# Patient Record
Sex: Male | Born: 1970 | Race: Black or African American | Hispanic: No | Marital: Married | Smoking: Never smoker
Health system: Southern US, Community
[De-identification: ages and names within clinical notes are randomized; demographics above are authoritative.]

---

## 2007-08-11 ENCOUNTER — Encounter: Admission: RE | Admit: 2007-08-11 | Discharge: 2007-08-11 | Payer: Self-pay | Admitting: General Practice

## 2009-05-12 IMAGING — CR DG CERVICAL SPINE COMPLETE 4+V
6 series · 6 of 6 positions shown · non-contrast
Comparison: None.

CLINICAL DATA: Neck pain radiating into shoulders and down both
arms

CERVICAL SPINE - COMPLETE 4+ VIEW

[view not recorded (1 of 6)]
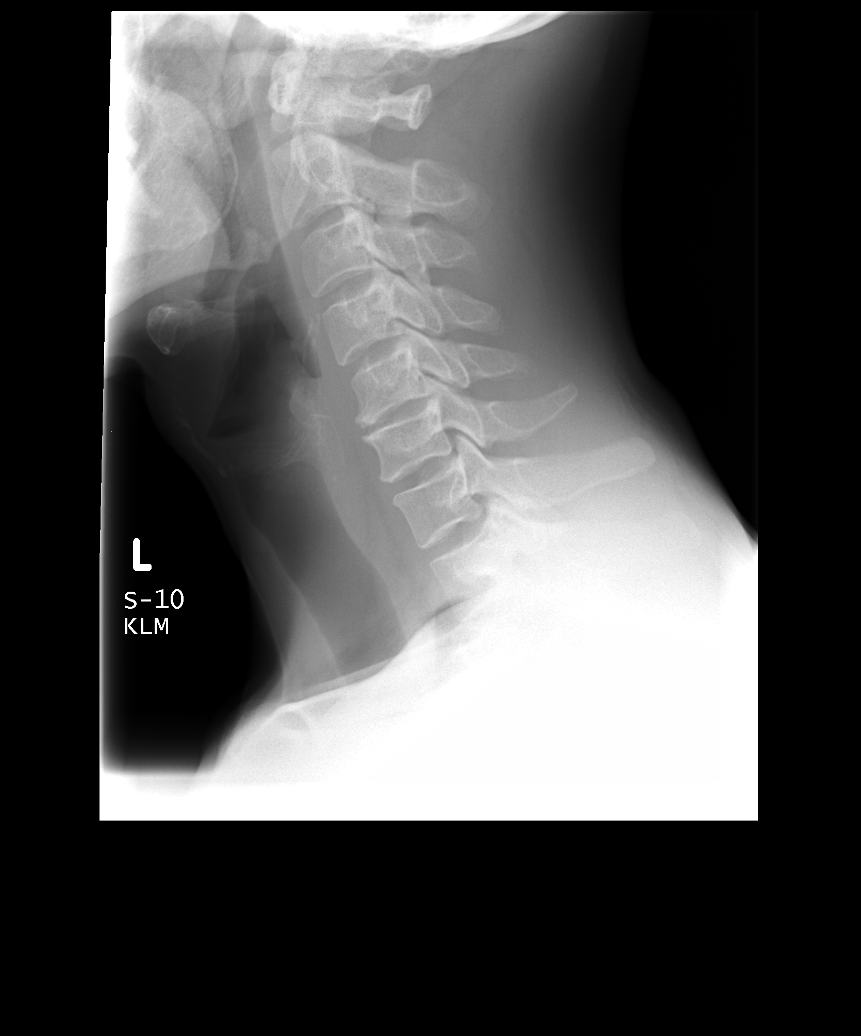

[view not recorded (2 of 6)]
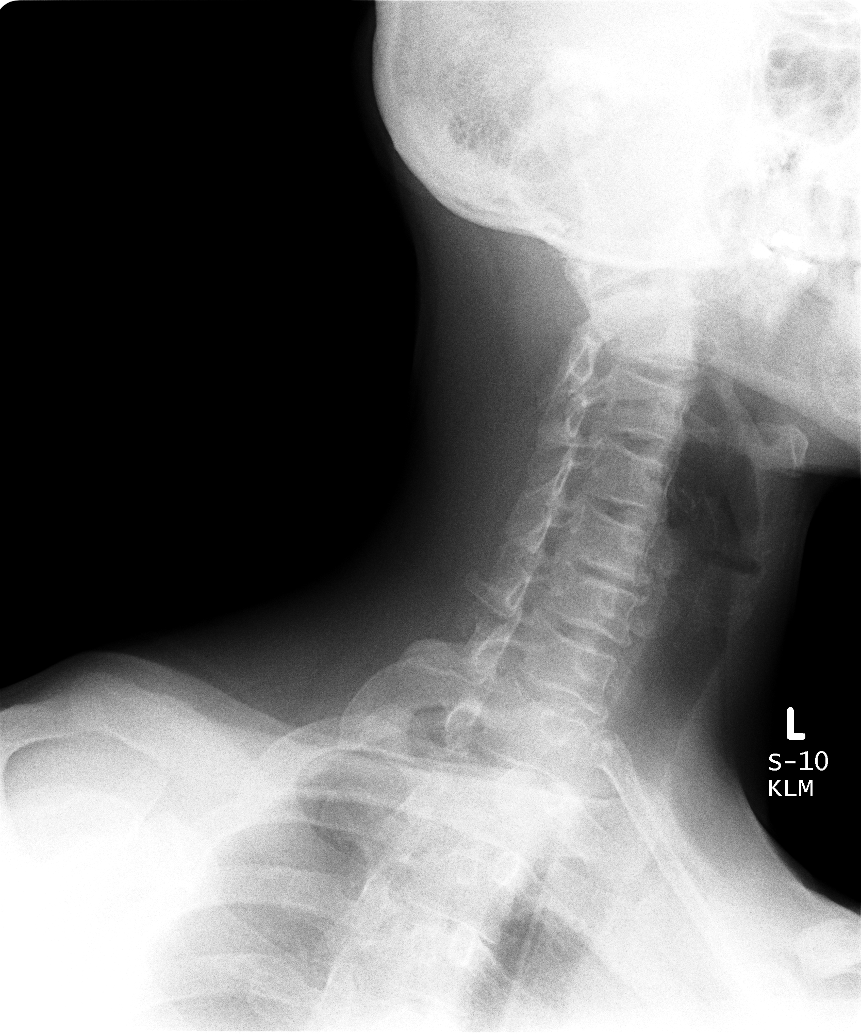

[view not recorded (3 of 6)]
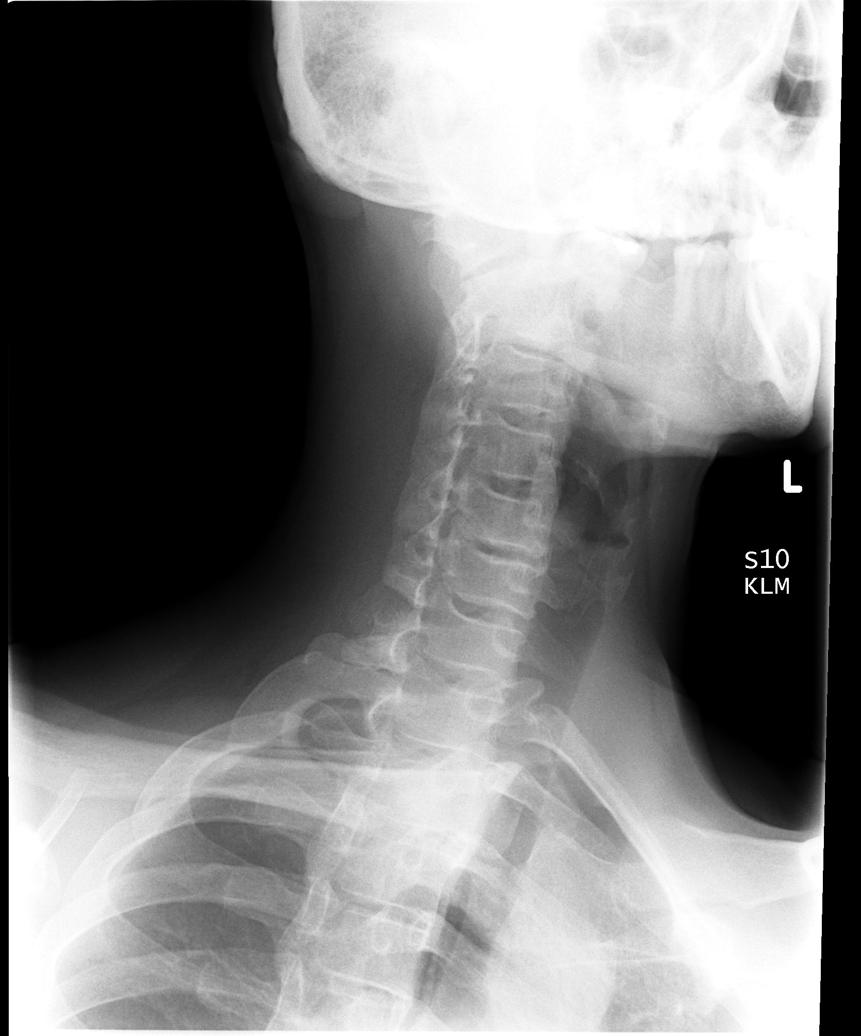

[view not recorded (4 of 6)]
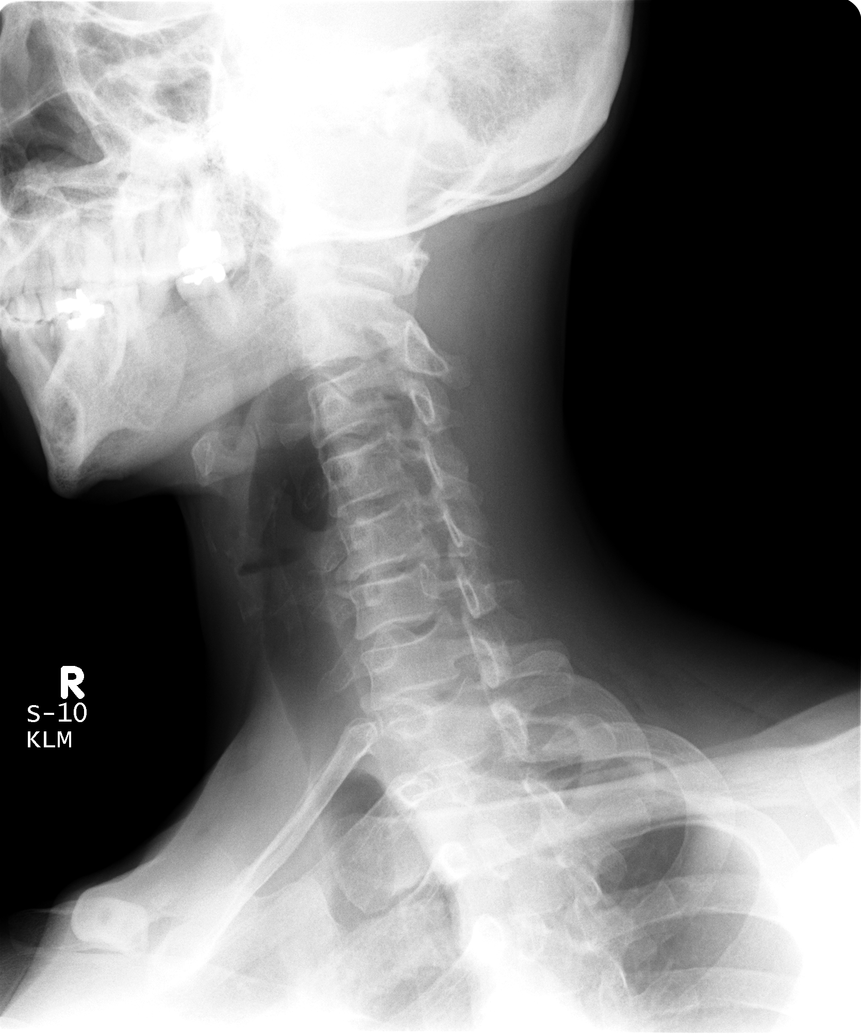

[view not recorded (5 of 6)]
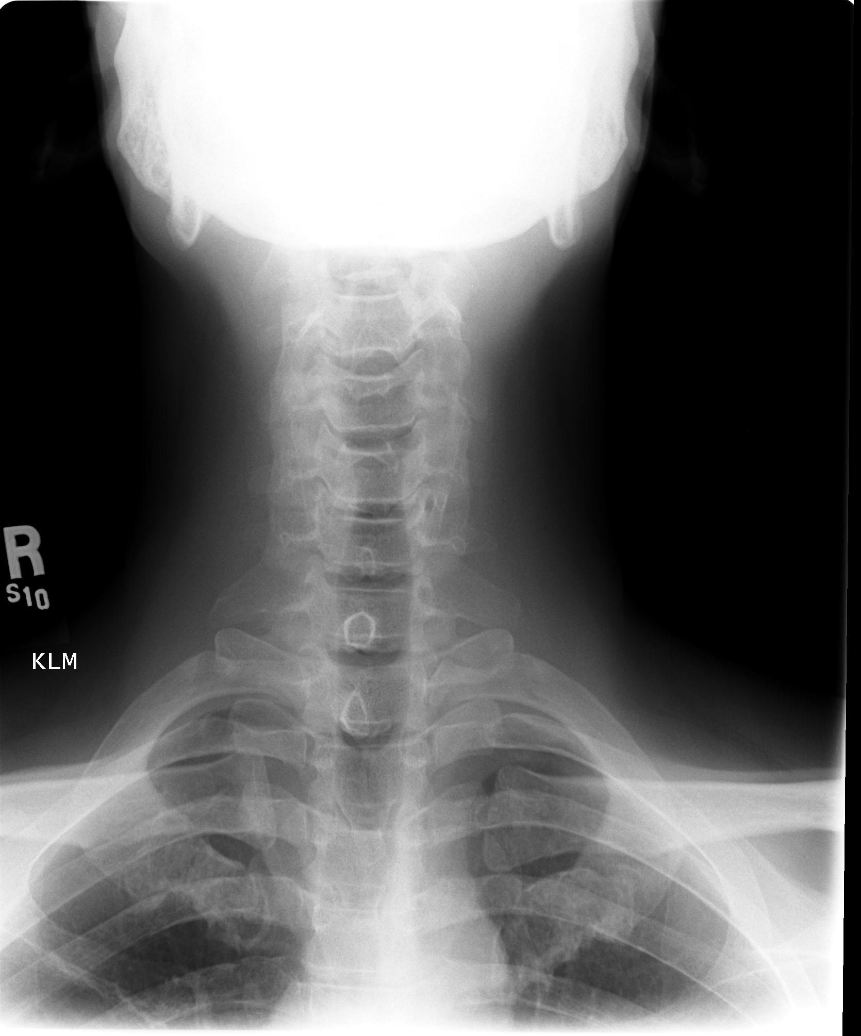

[view not recorded (6 of 6)]
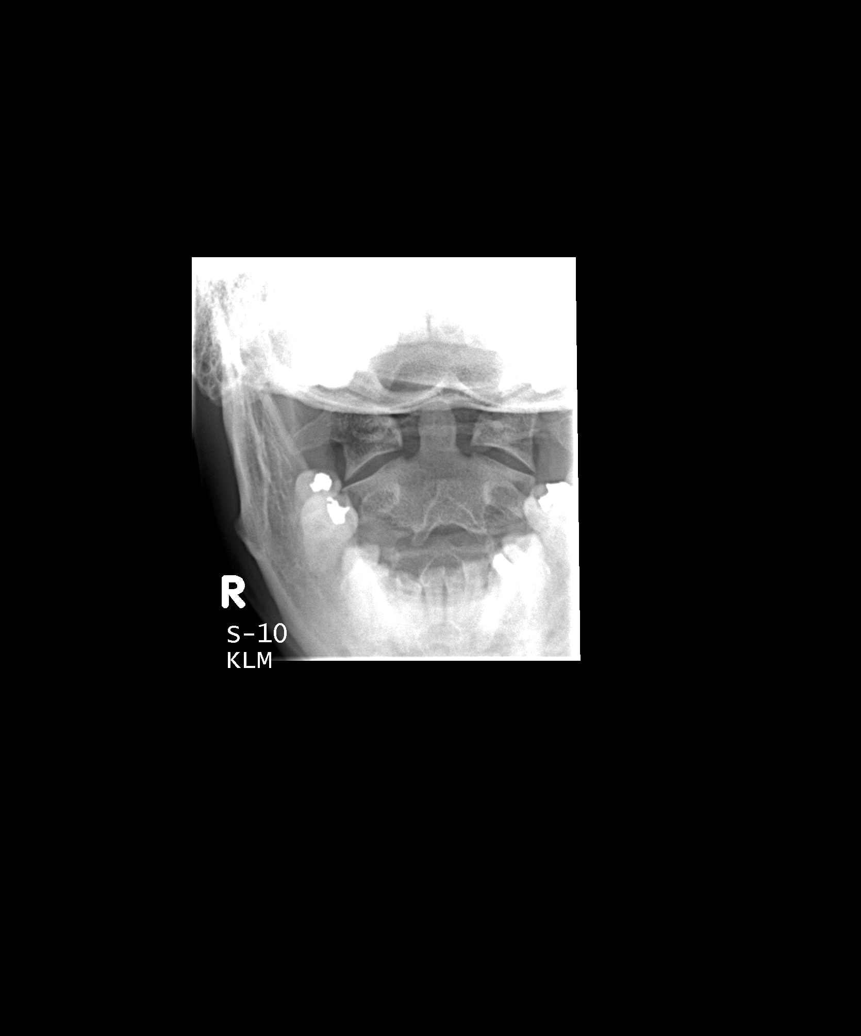

[6 of 6 positions shown; findings below may reference images not displayed]

FINDINGS: Alignment is anatomic.  There is disc space narrowing C5-
6 with osteophyte formation.  Bilateral neural foraminal narrowing
is present at this level.  No fracture or subluxation can be seen,
and the AP/odontoid views are unremarkable.  Lung apices are clear.
No abnormal calcifications are present.
IMPRESSION: Cervical spondylosis C5-6

## 2015-12-28 ENCOUNTER — Encounter (HOSPITAL_COMMUNITY): Payer: Self-pay | Admitting: Emergency Medicine

## 2015-12-28 ENCOUNTER — Ambulatory Visit (HOSPITAL_COMMUNITY)
Admission: EM | Admit: 2015-12-28 | Discharge: 2015-12-28 | Disposition: A | Discharge status: Home / Self Care | Payer: Managed Care, Other (non HMO) | Attending: Physician Assistant | Admitting: Physician Assistant

## 2015-12-28 DIAGNOSIS — F419 Anxiety disorder, unspecified: Secondary | ICD-10-CM | POA: Diagnosis not present

## 2015-12-28 DIAGNOSIS — R0789 Other chest pain: Secondary | ICD-10-CM

## 2015-12-28 MED ORDER — NAPROXEN 500 MG PO TABS: ORAL_TABLET | ORAL | 0 refills | Status: DC

## 2015-12-28 MED ORDER — CLONAZEPAM 0.5 MG PO TABS: ORAL_TABLET | ORAL | 0 refills | Status: DC

## 2015-12-28 NOTE — ED Triage Notes (Signed)
Here for intermittent right sided CP onset 2 months that has been getting worse  Reports pain increases w/anxiety or when he gets exited  Denies HA, SOB, diaphoresis, past med hx  A&O x4... NAD

## 2015-12-28 NOTE — Discharge Instructions (Signed)
Could be related to anxiety. But also Muscular skeletal pain. Use the Klonopin as needed for anxiety to see if this helps or the more muscular pain can try Naprosyn. At any time this becomes urgent then go the ER. Otherwise establish with PCP

## 2015-12-28 NOTE — ED Provider Notes (Signed)
CSN: [id number]     Arrival date & time 12/28/15  1933 History   None    Chief Complaint  Patient presents with  . Chest Pain   (Consider location/radiation/quality/duration/timing/severity/associated sxs/prior Treatment) 45 yo black male presents with intermittent right sided chest pain x 2 months. He carries no significant past medical history.  He reports that it occurs mainly with stress or worry and resolves when he relaxes. He reports that exercise does not affect his pain and no reflux symptoms. No dyspnea or diaphoresis with this occurrence. No pain with touch or ROM.       History reviewed. No pertinent past medical history. History reviewed. No pertinent surgical history. History reviewed. No pertinent family history. Social History  Substance Use Topics  . Smoking status: Never Smoker  . Smokeless tobacco: Never Used  . Alcohol use No    Review of Systems  Constitutional: Negative for diaphoresis and fatigue.  Respiratory: Negative for choking, chest tightness, shortness of breath and wheezing.   Cardiovascular: Positive for chest pain. Negative for palpitations and leg swelling.  Gastrointestinal: Negative for abdominal pain, nausea and vomiting.  Musculoskeletal: Negative for back pain.  Neurological: Negative for dizziness, weakness, light-headedness and numbness.  Psychiatric/Behavioral: Positive for behavioral problems.       Noted reports of anxiety    Allergies  Review of patient's allergies indicates no known allergies.  Home Medications   Prior to Admission medications   Medication Sig Start Date End Date Taking? Authorizing Provider  clonazePAM (KLONOPIN) 0.5 MG tablet 1 tablet every 12 hours as needed for anxiety 12/28/15   Riki SheerMichelle G Saim Almanza, PA-C  naproxen (NAPROSYN) 500 MG tablet 1 tablet every 12 hours as needed for chest wall pain 12/28/15   Riki SheerMichelle G Ashlynne Shetterly, PA-C   Meds Ordered and Administered this Visit  Medications - No data to display  BP  112/83 (BP Location: Left Arm)   Pulse 60   Temp 97.7 F (36.5 C) (Oral)   Resp 12   SpO2 100%  No data found.   Physical Exam  Constitutional: He is oriented to person, place, and time. He appears well-developed and well-nourished. No distress.  Sitting comfortable without any distress. Laughing and talking  Cardiovascular: Normal rate, regular rhythm and normal heart sounds.   Pulmonary/Chest: Effort normal and breath sounds normal. No respiratory distress. He has no wheezes. He exhibits no tenderness.  No reproducible pain with palpation or ROM  Musculoskeletal: Normal range of motion.  Neurological: He is alert and oriented to person, place, and time.  Skin: Skin is warm and dry. He is not diaphoretic.  Nursing note and vitals reviewed.   Urgent Care Course   Clinical Course    .EKG Date/Time: 12/28/2015 8:48 PM Performed by: Riki SheerYOUNG, Berkleigh Beckles G Authorized by: Riki SheerYOUNG, Breahna Boylen G   ECG reviewed by ED Physician in the absence of a cardiologist: yes   Previous ECG:    Previous ECG:  Unavailable Interpretation:    Interpretation: non-specific   Rhythm:    Rhythm: sinus bradycardia   ST segments:    ST segments:  Normal T waves:    T waves: normal     (including critical care time)  Labs Review Labs Reviewed - No data to display  Imaging Review No results found.   Visual Acuity Review  Right Eye Distance:   Left Eye Distance:   Bilateral Distance:    Right Eye Near:   Left Eye Near:    Bilateral Near:  MDM   1. Chest wall pain   2. Anxiety    1. Mild langauge barrier but patient reports right sided chest pain with anxiety and "worry", but at times with lifting weights. At any rate no pain with exertion or other cardiac associated symptoms are evident. EKG with mild axis deviation and 57 HR. But non-specific in this setting. We are going to treat with NSAID's for chest wall pain. He is educated that if pain worsens and becomes emergent then  proceed to ED. He expresses understanding.  2. Going to try Klonopin short term for this anxiety and chest pain but this RX is limited and would need /fu with PCP to prescribe this. He expresses understanding. No emergent need tonight and stable for D/C.     Riki Sheer, PA-C 12/28/15 2104

## 2017-01-14 ENCOUNTER — Ambulatory Visit (HOSPITAL_COMMUNITY)
Admission: EM | Admit: 2017-01-14 | Discharge: 2017-01-14 | Disposition: A | Discharge status: Home / Self Care | Payer: Self-pay | Attending: Family Medicine | Admitting: Family Medicine

## 2017-01-14 ENCOUNTER — Encounter (HOSPITAL_COMMUNITY): Payer: Self-pay | Admitting: Emergency Medicine

## 2017-01-14 DIAGNOSIS — B9789 Other viral agents as the cause of diseases classified elsewhere: Secondary | ICD-10-CM

## 2017-01-14 DIAGNOSIS — J069 Acute upper respiratory infection, unspecified: Secondary | ICD-10-CM

## 2017-01-14 MED ORDER — BENZONATATE 100 MG PO CAPS: 100.0000 mg | ORAL_CAPSULE | Freq: Three times a day (TID) | ORAL | 0 refills | Status: DC

## 2017-01-14 NOTE — ED Triage Notes (Signed)
Onset of symptoms 3 days ago.  Complains of headache, cough, fever.  Denies runny nose or a stuffy nose

## 2017-01-14 NOTE — ED Provider Notes (Signed)
  [facility]   [id number] 01/14/17 Arrival Time: 1135   SUBJECTIVE:  Clifford Chapman is a 46 y.o. male who presents to the urgent care with complaint of cough, fatigue, and sore throat. Denies congestion or runny nose, loss of appetite, nausea, vomiting, diarrhea, chest pain or palpitations, or other symptoms. Does not smoke, no history of lung disease such as asthma or COPD. 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 No pertinent past medical history. No family history on file. Social History   Social History  . Marital status: Married    Spouse name: N/A  . Number of children: N/A  . Years of education: N/A   Occupational History  . Not on file.   Social History Main Topics  . Smoking status: Never Smoker  . Smokeless tobacco: Never Used  . Alcohol use No  . Drug use: Unknown  . Sexual activity: Not on file   Other Topics Concern  . Not on file   Social History Narrative  . No narrative on file   Current Meds  Medication Sig  . acetaminophen (TYLENOL) 325 MG tablet Take 650 mg by mouth every 6 (six) hours as needed.   No Known Allergies    ROS: As per HPI, remainder of ROS negative.   OBJECTIVE:   Vitals:   01/14/17 1259  BP: 105/69  Pulse: 87  Resp: 20  Temp: 99.5 F (37.5 C)  TempSrc: Oral  SpO2: 97%     General appearance: alert; no distress Eyes:  conjunctiva normal HENT: normocephalic; atraumatic; TMs normal, canal normal, external ears normal without trauma; nasal mucosa normal; oral mucosa normal Neck: supple, No cervical lymphadenopathy Lungs: clear to auscultation bilaterally Heart: regular rate and rhythm Abdomen: soft, non-tender; bowel sounds normal; no masses or organomegaly; no guarding or rebound tenderness Extremities: no cyanosis or edema; symmetrical with no gross deformities Skin: warm and dry Neurologic: normal gait; grossly normal Psychological: alert and  cooperative; normal mood and affect      Labs:  No results found for this or any previous visit.  Labs Reviewed - No data to display  No results found.     ASSESSMENT & PLAN:  1. Viral URI with cough     Meds ordered this encounter  Medications  . acetaminophen (TYLENOL) 325 MG tablet    Sig: Take 650 mg by mouth every 6 (six) hours as needed.  . benzonatate (TESSALON) 100 MG capsule    Sig: Take 1 capsule (100 mg total) by mouth every 8 (eight) hours.    Dispense:  21 capsule    Refill:  0    Reviewed expectations re: course of current medical issues. Questions answered. Outlined signs and symptoms indicating need for more acute intervention. Patient verbalized understanding. After Visit Summary given.    Procedures:        Dorena BodoKennard, Jullisa Grigoryan, NP 01/14/17 1344

## 2017-01-14 NOTE — Discharge Instructions (Signed)
You most likely have a viral uri, this type of infection will not be helped by antibiotics.  For your symptoms I have prescribed Tessalon, take one tablet every 8 hours as needed for cough.   In addition to these therapies, I advise rest, plenty of fluids and management of symptoms with over the counter medicines. Over the counter therapies for your symptoms include:Tylenol as needed every 4-6 hours for body aches or fever, not to exceed 4,000 mg a day, Take mucinex or mucinex DM ever 12 hours with a full glass of water, you may use an inhaled steroid such as Flonase, 2 sprays each nostril once a day for congestion, or an antihistamine such as Claritin or Zyrtec once a day. Another alternative for congestion, is a pseudoephedrine containing product available from the pharmacist. Should your symptoms worsen or fail to resolve, follow up with your primary care provider or return to clinic.

## 2017-09-08 ENCOUNTER — Ambulatory Visit (HOSPITAL_COMMUNITY)
Admission: EM | Admit: 2017-09-08 | Discharge: 2017-09-08 | Disposition: A | Discharge status: Home / Self Care | Payer: BLUE CROSS/BLUE SHIELD | Attending: Family Medicine | Admitting: Family Medicine

## 2017-09-08 ENCOUNTER — Encounter (HOSPITAL_COMMUNITY): Payer: Self-pay | Admitting: Emergency Medicine

## 2017-09-08 DIAGNOSIS — M62838 Other muscle spasm: Secondary | ICD-10-CM | POA: Diagnosis not present

## 2017-09-08 MED ORDER — DICLOFENAC SODIUM 75 MG PO TBEC: 75.0000 mg | DELAYED_RELEASE_TABLET | Freq: Two times a day (BID) | ORAL | 0 refills | Status: DC

## 2017-09-08 NOTE — ED Triage Notes (Signed)
Pt sts right sided neck pain x 10 days

## 2017-09-08 NOTE — ED Provider Notes (Signed)
[facility]   [id number] 09/08/17 Arrival Time: 1233  ASSESSMENT & PLAN:  1. Muscle spasms of neck    Meds ordered this encounter  Medications  . diclofenac (VOLTAREN) 75 MG EC tablet    Sig: Take 1 tablet (75 mg total) by mouth 2 (two) times daily.    Dispense:  14 tablet    Refill:  0   Encouraged ROM. Heat. Will f/u if not improving over the next week.  Reviewed expectations re: course of current medical issues. Questions answered. Outlined signs and symptoms indicating need for more acute intervention. Patient verbalized understanding. After Visit Summary given.  SUBJECTIVE: History from: patient. Clifford Chapman is a 47 y.o. male who reports intermittent mild pain of his bilateral neck (R>L) that is on and off; stable; described as stiff and aching without radiation. Onset: gradual, over the past 10 days or so. Injury/trama: no. Relieved by: nothing in particular. Worsened by: certain movements when present. Associated symptoms: none reported. Extremity sensation changes or weakness: none. Self treatment: has not tried OTCs for relief of pain. History of similar: no  ROS: As per HPI.   OBJECTIVE:  Vitals:   09/08/17 1247 09/08/17 1248  BP: 121/77   Pulse: 68   Resp: 16   Temp: 98.2 F (36.8 C)   TempSrc: Oral   SpO2: 99%   Weight:  135 lb (61.2 kg)  Height:  6' (1.829 m)    General appearance: alert; no distress Extremities: warm and well perfused; symmetrical with no gross deformities; diffuse tenderness over his right posterior neck musculature with no swelling and no bruising; ROM: normal; no midline tenderness CV: normal extremity capillary refill Skin: warm and dry Neurologic: normal gait; normal symmetric reflexes in all extremities; normal sensation in all extremities Psychological: alert and cooperative; normal mood and affect  No Known Allergies  History reviewed. No pertinent past medical history. Social History   Socioeconomic  History  . Marital status: Married    Spouse name: Not on file  . Number of children: Not on file  . Years of education: Not on file  . Highest education level: Not on file  Occupational History  . Not on file  Social Needs  . Financial resource strain: Not on file  . Food insecurity:    Worry: Not on file    Inability: Not on file  . Transportation needs:    Medical: Not on file    Non-medical: Not on file  Tobacco Use  . Smoking status: Never Smoker  . Smokeless tobacco: Never Used  Substance and Sexual Activity  . Alcohol use: No  . Drug use: Not on file  . Sexual activity: Not on file  Lifestyle  . Physical activity:    Days per week: Not on file    Minutes per session: Not on file  . Stress: Not on file  Relationships  . Social connections:    Talks on phone: Not on file    Gets together: Not on file    Attends religious service: Not on file    Active member of club or organization: Not on file    Attends meetings of clubs or organizations: Not on file    Relationship status: Not on file  . Intimate partner violence:    Fear of current or ex partner: Not on file    Emotionally abused: Not on file    Physically abused: Not on file    Forced sexual activity: Not on file  Other Topics Concern  . Not on file  Social History Narrative  . Not on file   History reviewed. No pertinent family history. History reviewed. No pertinent surgical history.    Mardella Layman, MD 09/15/17 [id number]

## 2019-11-24 ENCOUNTER — Ambulatory Visit (HOSPITAL_COMMUNITY)
Admission: EM | Admit: 2019-11-24 | Discharge: 2019-11-24 | Disposition: A | Discharge status: Home / Self Care | Payer: BLUE CROSS/BLUE SHIELD | Attending: Family Medicine | Admitting: Family Medicine

## 2019-11-24 ENCOUNTER — Other Ambulatory Visit: Payer: Self-pay

## 2019-11-24 DIAGNOSIS — R1013 Epigastric pain: Secondary | ICD-10-CM

## 2019-11-24 LAB — COMPREHENSIVE METABOLIC PANEL
ALT: 13 U/L (ref 0–44)
AST: 19 U/L (ref 15–41)
Albumin: 4.2 g/dL (ref 3.5–5.0)
Alkaline Phosphatase: 46 U/L (ref 38–126)
Anion gap: 8 (ref 5–15)
BUN: 7 mg/dL (ref 6–20)
CO2: 27 mmol/L (ref 22–32)
Calcium: 9.3 mg/dL (ref 8.9–10.3)
Chloride: 104 mmol/L (ref 98–111)
Creatinine, Ser: 0.8 mg/dL (ref 0.61–1.24)
GFR calc Af Amer: >60 mL/min (ref >60)
GFR calc non Af Amer: >60 mL/min (ref >60)
Glucose, Bld: 99 mg/dL (ref 70–99)
Potassium: 3.8 mmol/L (ref 3.5–5.1)
Sodium: 139 mmol/L (ref 135–145)
Total Bilirubin: 1.3 mg/dL — ABNORMAL HIGH (ref 0.3–1.2)
Total Protein: 6.8 g/dL (ref 6.5–8.1)

## 2019-11-24 LAB — LIPASE, BLOOD: Lipase: 24 U/L (ref 11–51)

## 2019-11-24 MED ORDER — OMEPRAZOLE 20 MG PO CPDR: 20.0000 mg | DELAYED_RELEASE_CAPSULE | Freq: Every day | ORAL | 0 refills | Status: DC

## 2019-11-24 NOTE — ED Triage Notes (Signed)
C/o a burning pain in his right upper quad. Reports it is random. Denies referred chest pain.

## 2019-11-24 NOTE — Discharge Instructions (Signed)
Check your results on My Chart Take the omeprazole once a day in the morning on an empty stomach Make an appointment with a PCP for additional testing

## 2019-11-24 NOTE — ED Provider Notes (Signed)
[facility]    CSN: [id number] Arrival date & time: 11/24/19  1417      History   Chief Complaint Chief Complaint  Patient presents with  . Gastroesophageal Reflux    HPI Clifford Chapman is a 49 y.o. male.   HPI   Patient states that he has been having symptoms for 2 weeks.  He states sometimes when he swallows he feels like his food is stuck and he has to drink water to make it go down.  He states that when he eats, even a small amount, he will feel like he is full, and cannot eat anymore.  He feels a pressure in his epigastrium and right upper quadrant.  No nausea.  No vomiting.  No constipation or diarrhea.  Never had any prior stomach problems.  He does not smoke.  He does not drink alcohol.  He has not noticed that any specific food is worse than others.  He does not take a lot of NSAID drugs.  He does not currently have a primary care doctor.  He considers himself in good health  No past medical history on file.  There are no problems to display for this patient.   No past surgical history on file.     Home Medications    Prior to Admission medications   Medication Sig Start Date End Date Taking? Authorizing Provider  acetaminophen (TYLENOL) 325 MG tablet Take 650 mg by mouth every 6 (six) hours as needed.    [provider]  omeprazole (PRILOSEC) 20 MG capsule Take 1 capsule (20 mg total) by mouth daily. 11/24/19   Eustace Moore, MD  clonazePAM Scarlette Calico) 0.5 MG tablet 1 tablet every 12 hours as needed for anxiety 12/28/15 11/24/19  Riki Sheer, PA-C    Family History No family history on file.  Social History Social History   Tobacco Use  . Smoking status: Never Smoker  . Smokeless tobacco: Never Used  Substance Use Topics  . Alcohol use: No  . Drug use: Not on file     Allergies   Patient has no known allergies.   Review of Systems Review of Systems See HPI  Physical Exam Triage Vital Signs ED Triage Vitals  Enc  Vitals Group     BP 11/24/19 1541 103/78     Pulse Rate 11/24/19 1541 63     Resp 11/24/19 1541 14     Temp 11/24/19 1541 99 F (37.2 C)     Temp src --      SpO2 11/24/19 1541 100 %     Weight --      Height --      Head Circumference --      Peak Flow --      Pain Score 11/24/19 1537 5     Pain Loc --      Pain Edu? --      Excl. in GC? --    No data found.  Updated Vital Signs BP 103/78   Pulse 63   Temp 99 F (37.2 C)   Resp 14   SpO2 100%      Physical Exam Constitutional:      General: He is not in acute distress.    Appearance: He is well-developed and normal weight.  HENT:     Head: Normocephalic and atraumatic.  Eyes:     Conjunctiva/sclera: Conjunctivae normal.     Pupils: Pupils are equal, round, and reactive to light.  Cardiovascular:  Rate and Rhythm: Normal rate and regular rhythm.     Heart sounds: Normal heart sounds.  Pulmonary:     Effort: Pulmonary effort is normal. No respiratory distress.     Breath sounds: Normal breath sounds.  Abdominal:     General: Abdomen is flat. Bowel sounds are normal. There is no distension.     Palpations: Abdomen is soft.     Tenderness: There is no abdominal tenderness.     Comments: No palpable mass.  No palpable organomegaly.  Mild tenderness to deep palpation of the midepigastrium  Musculoskeletal:        General: Normal range of motion.     Cervical back: Normal range of motion and neck supple.  Skin:    General: Skin is warm and dry.  Neurological:     Mental Status: He is alert.  Psychiatric:        Mood and Affect: Mood normal.        Behavior: Behavior normal.      UC Treatments / Results  Labs (all labs ordered are listed, but only abnormal results are displayed) Labs Reviewed  COMPREHENSIVE METABOLIC PANEL  LIPASE, BLOOD    EKG   Radiology No results found.  Procedures Procedures (including critical care time)  Medications Ordered in UC Medications - No data to  display  Initial Impression / Assessment and Plan / UC Course  I have reviewed the triage vital signs and the nursing notes.  Pertinent labs & imaging results that were available during my care of the patient were reviewed by me and considered in my medical decision making (see chart for details).     Reviewed that if he needs additional testing he will need a primary care doctor to order an ultrasound of his right upper quadrant.  This would be the test appropriate to evaluate his gallbladder.  I am going to treat him with omeprazole, have him continue frequent small meals, and have given him the phone numbers of primary care offices that are taking new patients Final Clinical Impressions(s) / UC Diagnoses   Final diagnoses:  Abdominal pain, epigastric     Discharge Instructions     Check your results on My Chart Take the omeprazole once a day in the morning on an empty stomach Make an appointment with a PCP for additional testing   ED Prescriptions    Medication Sig Dispense Auth. Provider   omeprazole (PRILOSEC) 20 MG capsule Take 1 capsule (20 mg total) by mouth daily. 30 capsule Eustace Moore, MD     PDMP not reviewed this encounter.   Eustace Moore, MD 11/24/19 [phone]

## 2020-06-07 ENCOUNTER — Ambulatory Visit: Payer: BLUE CROSS/BLUE SHIELD | Admitting: Family Medicine

## 2020-07-03 ENCOUNTER — Encounter (HOSPITAL_COMMUNITY): Payer: Self-pay | Admitting: Emergency Medicine

## 2020-07-03 ENCOUNTER — Other Ambulatory Visit: Payer: Self-pay

## 2020-07-03 ENCOUNTER — Ambulatory Visit (HOSPITAL_COMMUNITY)
Admission: EM | Admit: 2020-07-03 | Discharge: 2020-07-03 | Disposition: A | Discharge status: Home / Self Care | Payer: BLUE CROSS/BLUE SHIELD | Attending: Emergency Medicine | Admitting: Emergency Medicine

## 2020-07-03 DIAGNOSIS — R1013 Epigastric pain: Secondary | ICD-10-CM

## 2020-07-03 MED ORDER — OMEPRAZOLE 20 MG PO CPDR: 20.0000 mg | DELAYED_RELEASE_CAPSULE | Freq: Every day | ORAL | 0 refills | Status: DC

## 2020-07-03 NOTE — Discharge Instructions (Signed)
Take the omeprazole as directed.    Establish a primary care provider.  Assistance from [facility] has been requested.

## 2020-07-03 NOTE — ED Triage Notes (Signed)
Pt presents with abdominal pain/tightness/burning. States has hx of reflux in the past. States was prescribed omeprazole in the past with relief. Does not have active RX at this time.

## 2020-07-03 NOTE — ED Provider Notes (Signed)
[facility]    CSN: [id number] Arrival date & time: 07/03/20  1023      History   Chief Complaint Chief Complaint  Patient presents with  . Abdominal Pain    HPI Clifford Chapman is a 50 y.o. male.   Patient presents with burning in his epigastric region intermittently x1 year.  The burning is worse with eating food.  He feels bloated after eating.  He also reports occasional vomiting; last episode 2 days ago.  Last bowel movement yesterday.  He denies fever, chills, diarrhea, dysuria, or other symptoms.  He was prescribed omeprazole here in July 2021 and reports good symptom relief.  He does not have a PCP established.  He denies other pertinent medical history.  The history is provided by the patient and medical records.    History reviewed. No pertinent past medical history.  There are no problems to display for this patient.   History reviewed. No pertinent surgical history.     Home Medications    Prior to Admission medications   Medication Sig Start Date End Date Taking? Authorizing Provider  acetaminophen (TYLENOL) 325 MG tablet Take 650 mg by mouth every 6 (six) hours as needed.    [provider]  omeprazole (PRILOSEC) 20 MG capsule Take 1 capsule (20 mg total) by mouth daily. 07/03/20   Mickie Bail, NP  clonazePAM Scarlette Calico) 0.5 MG tablet 1 tablet every 12 hours as needed for anxiety 12/28/15 11/24/19  Riki Sheer, PA-C    Family History History reviewed. No pertinent family history.  Social History Social History   Tobacco Use  . Smoking status: Never Smoker  . Smokeless tobacco: Never Used  Substance Use Topics  . Alcohol use: No     Allergies   Patient has no known allergies.   Review of Systems Review of Systems  Constitutional: Negative for chills and fever.  HENT: Negative for ear pain and sore throat.   Eyes: Negative for pain and visual disturbance.  Respiratory: Negative for cough and shortness of breath.    Cardiovascular: Negative for chest pain and palpitations.  Gastrointestinal: Positive for abdominal pain and vomiting. Negative for constipation and diarrhea.  Genitourinary: Negative for dysuria and hematuria.  Musculoskeletal: Negative for arthralgias and back pain.  Skin: Negative for color change and rash.  Neurological: Negative for seizures and syncope.  All other systems reviewed and are negative.    Physical Exam Triage Vital Signs ED Triage Vitals  Enc Vitals Group     BP      Pulse      Resp      Temp      Temp src      SpO2      Weight      Height      Head Circumference      Peak Flow      Pain Score      Pain Loc      Pain Edu?      Excl. in GC?    No data found.  Updated Vital Signs BP 121/83 (BP Location: Right Arm)   Pulse 66   Temp 98.1 F (36.7 C) (Oral)   Resp 16   SpO2 98%   Visual Acuity Right Eye Distance:   Left Eye Distance:   Bilateral Distance:    Right Eye Near:   Left Eye Near:    Bilateral Near:     Physical Exam Vitals and nursing note reviewed.  Constitutional:      General: He is not in acute distress.    Appearance: He is well-developed and well-nourished. He is not ill-appearing.  HENT:     Head: Normocephalic and atraumatic.     Mouth/Throat:     Mouth: Mucous membranes are moist.  Eyes:     Conjunctiva/sclera: Conjunctivae normal.  Cardiovascular:     Rate and Rhythm: Normal rate and regular rhythm.     Heart sounds: Normal heart sounds.  Pulmonary:     Effort: Pulmonary effort is normal. No respiratory distress.     Breath sounds: Normal breath sounds.  Abdominal:     General: Bowel sounds are normal. There is no distension.     Palpations: Abdomen is soft.     Tenderness: There is no abdominal tenderness. There is no guarding or rebound.  Musculoskeletal:        General: No edema.     Cervical back: Neck supple.  Skin:    General: Skin is warm and dry.  Neurological:     General: No focal deficit  present.     Mental Status: He is alert and oriented to person, place, and time.     Gait: Gait normal.  Psychiatric:        Mood and Affect: Mood and affect and mood normal.        Behavior: Behavior normal.      UC Treatments / Results  Labs (all labs ordered are listed, but only abnormal results are displayed) Labs Reviewed - No data to display  EKG   Radiology No results found.  Procedures Procedures (including critical care time)  Medications Ordered in UC Medications - No data to display  Initial Impression / Assessment and Plan / UC Course  I have reviewed the triage vital signs and the nursing notes.  Pertinent labs & imaging results that were available during my care of the patient were reviewed by me and considered in my medical decision making (see chart for details).   Epigastric pain.  Treating with omeprazole as this gave the patient good relief in the past.  Prescription provided but also discussed with patient that this medication is available over-the-counter and that he can ask the pharmacy for assistance in locating it.  Discussed with patient that he needs to establish a primary care provider for ongoing evaluation of this chronic issue.  Assistance from [facility] requested.  Patient agrees to plan of care.   Final Clinical Impressions(s) / UC Diagnoses   Final diagnoses:  Epigastric pain     Discharge Instructions     Take the omeprazole as directed.    Establish a primary care provider.  Assistance from [facility] has been requested.        ED Prescriptions    Medication Sig Dispense Auth. Provider   omeprazole (PRILOSEC) 20 MG capsule Take 1 capsule (20 mg total) by mouth daily. 30 capsule Mickie Bail, NP     PDMP not reviewed this encounter.   Mickie Bail, NP 07/03/20 1104

## 2020-07-12 ENCOUNTER — Ambulatory Visit: Payer: BLUE CROSS/BLUE SHIELD | Admitting: Family Medicine

## 2021-09-04 NOTE — Patient Instructions (Addendum)
It was nice seeing you today! ? ?Great to meet you! ? ?Blood work today. ? ?Come back and see me if your abdominal pain or dizziness are getting worse. ? ?Stay well, ?Littie Deeds, MD ?[facility] ?([phone] ? ?-- ? ?Make sure to check out at the front desk before you leave today. ? ?Please arrive at least 15 minutes prior to your scheduled appointments. ? ?If you had blood work today, I will send you a MyChart message or a letter if results are normal. Otherwise, I will give you a call. ? ?If you had a referral placed, they will call you to set up an appointment. Please give [place] a call if you don't hear back in the next 2 weeks. ? ?If you need additional refills before your next appointment, please call your pharmacy first.  ?

## 2021-09-04 NOTE — Progress Notes (Signed)
? ? ?  SUBJECTIVE:  ? ?CHIEF COMPLAINT / HPI:  ?Chief Complaint  ?Patient presents with  ? Establish Care  ?  ?No regular medications. Occasional Tylenol for pain. ?No PSHx. ?No known medical issues. ?NKDA. ?Lives with wife and 5 kids (youngest 74). ?Works at [employer], Product manager. ?Denies smoking history, alcohol use, recreational drug use. ? ?He reports an intermittent sensation in his upper abdomen that occurs after eating very occasionally, about once a month that goes away spontaneously.  Has been prescribed omeprazole previously which has helped with symptoms.  He is concerned about his liver.  Denies diarrhea, constipation, blood in stool, nausea, vomiting. ? ?He also reports occasional dizziness after standing up, usually goes away within a minute. ? ?PERTINENT  PMH / PSH: None ? ?Patient Care Team: ?Littie Deeds, MD as PCP - General (Family Medicine)  ? ?OBJECTIVE:  ? ?BP 108/66   Pulse 60   Ht 6' (1.829 m)   Wt 161 lb 8 oz (73.3 kg)   SpO2 100%   BMI 21.90 kg/m?   ?Physical Exam ?Constitutional:   ?   General: He is not in acute distress. ?HENT:  ?   Head: Normocephalic and atraumatic.  ?Eyes:  ?   General: No scleral icterus. ?   Extraocular Movements: Extraocular movements intact.  ?Cardiovascular:  ?   Rate and Rhythm: Normal rate and regular rhythm.  ?Pulmonary:  ?   Effort: Pulmonary effort is normal. No respiratory distress.  ?   Breath sounds: Normal breath sounds.  ?Abdominal:  ?   General: Bowel sounds are normal.  ?   Palpations: Abdomen is soft.  ?   Tenderness: There is no abdominal tenderness.  ?Musculoskeletal:  ?   Cervical back: Neck supple.  ?Neurological:  ?   Mental Status: He is alert.  ?  ? ? ?  09/05/2021  ?  3:43 PM  ?Depression screen PHQ 2/9  ?Decreased Interest 0  ?Down, Depressed, Hopeless 0  ?PHQ - 2 Score 0  ?Altered sleeping 0  ?Tired, decreased energy 0  ?Change in appetite 0  ?Feeling bad or failure about yourself  0  ?Trouble concentrating 0  ?Moving slowly or fidgety/restless 0   ?Suicidal thoughts 0  ?PHQ-9 Score 0  ?  ? ?{Show previous vital signs (optional):23777} ? ? ? ?ASSESSMENT/PLAN:  ? ?Encounter to establish care ? ?Abdominal discomfort ?Mild and sporadic.  Possibly acid reflux.  Check CBC and CMP today, advised to follow-up for further evaluation if issue persists or becomes more bothersome. ? ?Dizziness ?Mild, sounds like possibly orthostatic dizziness.  Advised to hydrate and stand up slowly.  Follow-up if symptoms persist or becoming more bothersome. ? ?HCM ?- Tdap due ?- shingles vaccine ordered to pharmacy ?- colonoscopy discussed, would like to defer at this time ?- HIV screening ordered ?- HCV screening ordered ?- A1c ?- lipid panel ? ?Return in about 1 year (around 09/06/2022) for physical.  ? ?Littie Deeds, MD ?[facility]  ?

## 2021-09-05 ENCOUNTER — Encounter: Payer: Self-pay | Admitting: Family Medicine

## 2021-09-05 ENCOUNTER — Ambulatory Visit (INDEPENDENT_AMBULATORY_CARE_PROVIDER_SITE_OTHER): Payer: Commercial Managed Care - HMO | Admitting: Family Medicine

## 2021-09-05 VITALS — BP 108/66 | HR 60 | Ht 72.0 in | Wt 161.5 lb

## 2021-09-05 DIAGNOSIS — Z1159 Encounter for screening for other viral diseases: Secondary | ICD-10-CM | POA: Diagnosis not present

## 2021-09-05 DIAGNOSIS — R739 Hyperglycemia, unspecified: Secondary | ICD-10-CM

## 2021-09-05 DIAGNOSIS — R109 Unspecified abdominal pain: Secondary | ICD-10-CM

## 2021-09-05 DIAGNOSIS — Z13220 Encounter for screening for lipoid disorders: Secondary | ICD-10-CM

## 2021-09-05 DIAGNOSIS — Z7689 Persons encountering health services in other specified circumstances: Secondary | ICD-10-CM

## 2021-09-05 DIAGNOSIS — Z114 Encounter for screening for human immunodeficiency virus [HIV]: Secondary | ICD-10-CM

## 2021-09-05 MED ORDER — ZOSTER VAC RECOMB ADJUVANTED 50 MCG/0.5ML IM SUSR: 0.5000 mL | Freq: Once | INTRAMUSCULAR | 0 refills | Status: AC

## 2021-09-06 LAB — COMPREHENSIVE METABOLIC PANEL
ALT: 12 IU/L (ref 0–44)
AST: 15 IU/L (ref 0–40)
Albumin/Globulin Ratio: 1.8 (ref 1.2–2.2)
Albumin: 4.4 g/dL (ref 3.8–4.9)
Alkaline Phosphatase: 54 IU/L (ref 44–121)
BUN/Creatinine Ratio: 12 (ref 9–20)
BUN: 11 mg/dL (ref 6–24)
Bilirubin Total: 0.7 mg/dL (ref 0.0–1.2)
CO2: 27 mmol/L (ref 20–29)
Calcium: 9.5 mg/dL (ref 8.7–10.2)
Chloride: 102 mmol/L (ref 96–106)
Creatinine, Ser: 0.94 mg/dL (ref 0.76–1.27)
Globulin, Total: 2.5 g/dL (ref 1.5–4.5)
Glucose: 82 mg/dL (ref 70–99)
Potassium: 4.2 mmol/L (ref 3.5–5.2)
Sodium: 142 mmol/L (ref 134–144)
Total Protein: 6.9 g/dL (ref 6.0–8.5)
eGFR: 98 mL/min/{1.73_m2} (ref >59)

## 2021-09-06 LAB — LIPID PANEL
Chol/HDL Ratio: 2.7 ratio (ref 0.0–5.0)
Cholesterol, Total: 170 mg/dL (ref 100–199)
HDL: 63 mg/dL (ref >39)
LDL Chol Calc (NIH): 93 mg/dL (ref 0–99)
Triglycerides: 71 mg/dL (ref 0–149)
VLDL Cholesterol Cal: 14 mg/dL (ref 5–40)

## 2021-09-06 LAB — HIV ANTIBODY (ROUTINE TESTING W REFLEX): HIV Screen 4th Generation wRfx: NONREACTIVE

## 2021-09-06 LAB — CBC
Hematocrit: 40.2 % (ref 37.5–51.0)
Hemoglobin: 13.7 g/dL (ref 13.0–17.7)
MCH: 30.2 pg (ref 26.6–33.0)
MCHC: 34.1 g/dL (ref 31.5–35.7)
MCV: 89 fL (ref 79–97)
Platelets: 201 10*3/uL (ref 150–450)
RBC: 4.53 x10E6/uL (ref 4.14–5.80)
RDW: 13.4 % (ref 11.6–15.4)
WBC: 3.2 10*3/uL — ABNORMAL LOW (ref 3.4–10.8)

## 2021-09-06 LAB — HEMOGLOBIN A1C
Est. average glucose Bld gHb Est-mCnc: 123 mg/dL
Hgb A1c MFr Bld: 5.9 % — ABNORMAL HIGH (ref 4.8–5.6)

## 2021-09-06 LAB — HCV AB W REFLEX TO QUANT PCR: HCV Ab: NONREACTIVE

## 2021-09-06 LAB — HCV INTERPRETATION

## 2021-09-08 ENCOUNTER — Encounter: Payer: Self-pay | Admitting: Family Medicine

## 2022-11-15 NOTE — Progress Notes (Deleted)
    SUBJECTIVE:   CHIEF COMPLAINT / HPI: Abdominal Pain  ***  PERTINENT  PMH / PSH: ***  OBJECTIVE:   There were no vitals taken for this visit.  ***  ASSESSMENT/PLAN:   No problem-specific Assessment & Plan notes found for this encounter.     Levin Erp, MD [facility]

## 2022-11-16 ENCOUNTER — Ambulatory Visit: Payer: Commercial Managed Care - HMO

## 2022-11-17 ENCOUNTER — Ambulatory Visit (INDEPENDENT_AMBULATORY_CARE_PROVIDER_SITE_OTHER): Payer: Commercial Managed Care - HMO | Admitting: Family Medicine

## 2022-11-17 VITALS — BP 98/58 | HR 68 | Wt 163.0 lb

## 2022-11-17 DIAGNOSIS — R1010 Upper abdominal pain, unspecified: Secondary | ICD-10-CM | POA: Diagnosis not present

## 2022-11-17 NOTE — Progress Notes (Signed)
  SUBJECTIVE:   CHIEF COMPLAINT / HPI:   Upper abdominal pain x2wks -Initially started as RLQ pain then became more RUQ/epigastric -no fever, N/V/D -not worse/better with food -Feels bloated/full after eating -no reflux/heartburn -Has not tried OTC meds to help with pain.  Does not remember ever taking Prilosec -Denies any alcohol, illicit drug use  PERTINENT  PMH / PSH:   No past medical history on file.  OBJECTIVE:  BP (!) 98/58   Pulse 68   Wt 163 lb (73.9 kg)   SpO2 98%   BMI 22.11 kg/m   General: NAD, pleasant, able to participate in exam Cardiac: RRR, no murmurs auscultated Respiratory: CTAB, normal WOB Abdomen: soft, mild tenderness to palpation in RUQ and epigastric region.  Nontender in RLQ and LUQ/LLQ.  Negative Murphy sign.  No rebound/guarding.  BS+. Extremities: warm and well perfused, no edema or cyanosis Skin: warm and dry, no rashes noted Neuro: alert, no obvious focal deficits, speech normal Psych: Normal affect and mood  ASSESSMENT/PLAN:   1. Pain of upper abdomen Differential for his presentation includes cholecystitis/cholelithiasis, biliary colic, pancreatitis, PUD/gastritis/GERD, less likely appendicitis.  Does not have an acute abdomen on exam.  Reassured that there is no history of recent fevers, N/V/D, and p.o. intake has been normal.  Will obtain labs as below for further evaluation.  Also obtain RUQ ultrasound to evaluate for hepatobiliary etiology.  Can also try OTC Prilosec or Pepcid in the meantime.  Follow-up 2-3 weeks  - CBC with Differential - Comprehensive metabolic panel - Lipase - US Abdomen Limited RUQ (LIVER/GB); Future - H. pylori breath test  No orders of the defined types were placed in this encounter.  Return in about 3 weeks (around 12/08/2022).  Vonna Drafts, MD [facility]

## 2022-11-17 NOTE — Patient Instructions (Addendum)
We are getting an ultrasound of your gallbladder and liver.  We are also getting some blood work and testing for infection.  I will give you a call if any of your results are abnormal.  Otherwise I will send you a letter.  In the meantime, you can try using over-the-counter Prilosec (omeprazole) or famotidine (Pepcid) to see if these help with your symptoms.    Please seek medical attention or let our office know if you start having severe worsening of your pain, fevers, nausea/vomiting, diarrhea, or are unable to keep down any food.  Vonna Drafts, MD

## 2022-11-19 LAB — CBC WITH DIFFERENTIAL/PLATELET
Basophils Absolute: 0 10*3/uL (ref 0.0–0.2)
Basos: 1 %
EOS (ABSOLUTE): 0.2 10*3/uL (ref 0.0–0.4)
Eos: 5 %
Hematocrit: 38 % (ref 37.5–51.0)
Hemoglobin: 12.8 g/dL — ABNORMAL LOW (ref 13.0–17.7)
Immature Grans (Abs): 0 10*3/uL (ref 0.0–0.1)
Immature Granulocytes: 0 %
Lymphocytes Absolute: 1.1 10*3/uL (ref 0.7–3.1)
Lymphs: 28 %
MCH: 29.4 pg (ref 26.6–33.0)
MCHC: 33.7 g/dL (ref 31.5–35.7)
MCV: 87 fL (ref 79–97)
Monocytes Absolute: 0.4 10*3/uL (ref 0.1–0.9)
Monocytes: 11 %
Neutrophils Absolute: 2.1 10*3/uL (ref 1.4–7.0)
Neutrophils: 55 %
Platelets: 205 10*3/uL (ref 150–450)
RBC: 4.36 x10E6/uL (ref 4.14–5.80)
RDW: 12.7 % (ref 11.6–15.4)
WBC: 3.8 10*3/uL (ref 3.4–10.8)

## 2022-11-19 LAB — COMPREHENSIVE METABOLIC PANEL
ALT: 13 IU/L (ref 0–44)
AST: 14 IU/L (ref 0–40)
Albumin: 4.1 g/dL (ref 3.8–4.9)
Alkaline Phosphatase: 71 IU/L (ref 44–121)
BUN/Creatinine Ratio: 12 (ref 9–20)
BUN: 10 mg/dL (ref 6–24)
Bilirubin Total: 0.5 mg/dL (ref 0.0–1.2)
CO2: 22 mmol/L (ref 20–29)
Calcium: 9.3 mg/dL (ref 8.7–10.2)
Chloride: 104 mmol/L (ref 96–106)
Creatinine, Ser: 0.82 mg/dL (ref 0.76–1.27)
Globulin, Total: 2.7 g/dL (ref 1.5–4.5)
Glucose: 128 mg/dL — ABNORMAL HIGH (ref 70–99)
Potassium: 4.1 mmol/L (ref 3.5–5.2)
Sodium: 140 mmol/L (ref 134–144)
Total Protein: 6.8 g/dL (ref 6.0–8.5)
eGFR: 105 mL/min/{1.73_m2} (ref >59)

## 2022-11-19 LAB — LIPASE: Lipase: 23 U/L (ref 13–78)

## 2022-11-19 LAB — H. PYLORI BREATH TEST: H pylori Breath Test: POSITIVE — AB

## 2022-11-23 ENCOUNTER — Ambulatory Visit (HOSPITAL_COMMUNITY)
Admission: RE | Admit: 2022-11-23 | Discharge: 2022-11-23 | Disposition: A | Discharge status: Home / Self Care | Payer: Commercial Managed Care - HMO | Source: Ambulatory Visit | Attending: Family Medicine | Admitting: Family Medicine

## 2022-11-23 ENCOUNTER — Encounter: Payer: Self-pay | Admitting: Family Medicine

## 2022-11-23 DIAGNOSIS — R1010 Upper abdominal pain, unspecified: Secondary | ICD-10-CM | POA: Insufficient documentation

## 2022-11-27 ENCOUNTER — Telehealth: Payer: Self-pay | Admitting: Family Medicine

## 2022-11-27 DIAGNOSIS — A048 Other specified bacterial intestinal infections: Secondary | ICD-10-CM

## 2022-11-27 MED ORDER — PANTOPRAZOLE SODIUM 40 MG PO TBEC: 40.0000 mg | DELAYED_RELEASE_TABLET | Freq: Two times a day (BID) | ORAL | 0 refills | Status: AC

## 2022-11-27 MED ORDER — TETRACYCLINE HCL 500 MG PO CAPS: 500.0000 mg | ORAL_CAPSULE | Freq: Four times a day (QID) | ORAL | 0 refills | Status: AC

## 2022-11-27 MED ORDER — METRONIDAZOLE 250 MG PO TABS: 250.0000 mg | ORAL_TABLET | Freq: Four times a day (QID) | ORAL | 0 refills | Status: AC

## 2022-11-27 MED ORDER — BISMUTH SUBSALICYLATE 525 MG PO TABS: 1.0000 | ORAL_TABLET | Freq: Four times a day (QID) | ORAL | 0 refills | Status: AC

## 2022-11-27 NOTE — Telephone Encounter (Signed)
Called patient to discuss H. pylori infection.  Will start quadruple therapy with: Metronidazole 250 mg 4 times daily for 14 days Tetracycline 500 mg 4 times daily for 14 days Protonix 40 mg 2 times daily for 14 days  Bismuth 525 mg 4 times daily for 14 days  Discussed regimen with the patient and he expresses understanding.  Agrees to reach out with any concerns after starting therapy.  Recommend follow-up in about 1 month.  Otherwise labs are unremarkable.  Vonna Drafts, MD
